# Patient Record
Sex: Female | Born: 1990 | Race: Asian | Hispanic: Yes | Marital: Single | Smoking: Never smoker
Health system: Southern US, Community
[De-identification: ages and names within clinical notes are randomized; demographics above are authoritative.]

## PROBLEM LIST (undated history)

## (undated) DIAGNOSIS — M329 Systemic lupus erythematosus, unspecified: Secondary | ICD-10-CM

## (undated) DIAGNOSIS — G43909 Migraine, unspecified, not intractable, without status migrainosus: Secondary | ICD-10-CM

## (undated) DIAGNOSIS — IMO0002 Reserved for concepts with insufficient information to code with codable children: Secondary | ICD-10-CM

## (undated) DIAGNOSIS — E282 Polycystic ovarian syndrome: Secondary | ICD-10-CM

---

## 2016-05-04 DIAGNOSIS — J4520 Mild intermittent asthma, uncomplicated: Secondary | ICD-10-CM | POA: Diagnosis not present

## 2016-05-04 DIAGNOSIS — L930 Discoid lupus erythematosus: Secondary | ICD-10-CM | POA: Diagnosis not present

## 2016-06-28 DIAGNOSIS — L930 Discoid lupus erythematosus: Secondary | ICD-10-CM | POA: Diagnosis not present

## 2016-06-28 DIAGNOSIS — M2550 Pain in unspecified joint: Secondary | ICD-10-CM | POA: Diagnosis not present

## 2016-06-28 DIAGNOSIS — R5383 Other fatigue: Secondary | ICD-10-CM | POA: Diagnosis not present

## 2016-09-04 DIAGNOSIS — L930 Discoid lupus erythematosus: Secondary | ICD-10-CM | POA: Diagnosis not present

## 2016-09-04 DIAGNOSIS — M2550 Pain in unspecified joint: Secondary | ICD-10-CM | POA: Diagnosis not present

## 2016-09-04 DIAGNOSIS — G43809 Other migraine, not intractable, without status migrainosus: Secondary | ICD-10-CM | POA: Diagnosis not present

## 2016-11-08 DIAGNOSIS — K219 Gastro-esophageal reflux disease without esophagitis: Secondary | ICD-10-CM | POA: Diagnosis not present

## 2016-12-27 DIAGNOSIS — Z23 Encounter for immunization: Secondary | ICD-10-CM | POA: Diagnosis not present

## 2016-12-27 DIAGNOSIS — G43809 Other migraine, not intractable, without status migrainosus: Secondary | ICD-10-CM | POA: Diagnosis not present

## 2016-12-27 DIAGNOSIS — L930 Discoid lupus erythematosus: Secondary | ICD-10-CM | POA: Diagnosis not present

## 2016-12-27 DIAGNOSIS — M2550 Pain in unspecified joint: Secondary | ICD-10-CM | POA: Diagnosis not present

## 2017-03-18 DIAGNOSIS — L930 Discoid lupus erythematosus: Secondary | ICD-10-CM | POA: Diagnosis not present

## 2017-04-18 DIAGNOSIS — L930 Discoid lupus erythematosus: Secondary | ICD-10-CM | POA: Diagnosis not present

## 2017-04-18 DIAGNOSIS — B078 Other viral warts: Secondary | ICD-10-CM | POA: Diagnosis not present

## 2017-06-03 ENCOUNTER — Other Ambulatory Visit: Payer: Self-pay | Admitting: Family Medicine

## 2017-06-03 ENCOUNTER — Other Ambulatory Visit (HOSPITAL_COMMUNITY)
Admission: RE | Admit: 2017-06-03 | Discharge: 2017-06-03 | Disposition: A | Discharge status: Home / Self Care | Payer: 59 | Source: Ambulatory Visit | Attending: Family Medicine | Admitting: Family Medicine

## 2017-06-03 DIAGNOSIS — Z131 Encounter for screening for diabetes mellitus: Secondary | ICD-10-CM | POA: Diagnosis not present

## 2017-06-03 DIAGNOSIS — G43909 Migraine, unspecified, not intractable, without status migrainosus: Secondary | ICD-10-CM | POA: Diagnosis not present

## 2017-06-03 DIAGNOSIS — L930 Discoid lupus erythematosus: Secondary | ICD-10-CM | POA: Diagnosis not present

## 2017-06-03 DIAGNOSIS — Z124 Encounter for screening for malignant neoplasm of cervix: Secondary | ICD-10-CM | POA: Insufficient documentation

## 2017-06-03 DIAGNOSIS — Z13220 Encounter for screening for lipoid disorders: Secondary | ICD-10-CM | POA: Diagnosis not present

## 2017-06-03 DIAGNOSIS — Z01419 Encounter for gynecological examination (general) (routine) without abnormal findings: Secondary | ICD-10-CM | POA: Diagnosis not present

## 2017-06-03 DIAGNOSIS — Z0001 Encounter for general adult medical examination with abnormal findings: Secondary | ICD-10-CM | POA: Diagnosis not present

## 2017-06-05 LAB — CYTOLOGY - PAP
CHLAMYDIA, DNA PROBE: NEGATIVE
HPV: NOT DETECTED
Neisseria Gonorrhea: NEGATIVE

## 2017-06-24 DIAGNOSIS — L930 Discoid lupus erythematosus: Secondary | ICD-10-CM | POA: Diagnosis not present

## 2017-06-26 DIAGNOSIS — L568 Other specified acute skin changes due to ultraviolet radiation: Secondary | ICD-10-CM | POA: Diagnosis not present

## 2017-06-26 DIAGNOSIS — M2550 Pain in unspecified joint: Secondary | ICD-10-CM | POA: Diagnosis not present

## 2017-06-26 DIAGNOSIS — L930 Discoid lupus erythematosus: Secondary | ICD-10-CM | POA: Diagnosis not present

## 2017-07-29 DIAGNOSIS — R87612 Low grade squamous intraepithelial lesion on cytologic smear of cervix (LGSIL): Secondary | ICD-10-CM | POA: Diagnosis not present

## 2017-07-29 DIAGNOSIS — R102 Pelvic and perineal pain: Secondary | ICD-10-CM | POA: Diagnosis not present

## 2017-09-24 DIAGNOSIS — L930 Discoid lupus erythematosus: Secondary | ICD-10-CM | POA: Diagnosis not present

## 2017-10-28 DIAGNOSIS — M2550 Pain in unspecified joint: Secondary | ICD-10-CM | POA: Diagnosis not present

## 2017-10-28 DIAGNOSIS — E559 Vitamin D deficiency, unspecified: Secondary | ICD-10-CM | POA: Diagnosis not present

## 2017-10-28 DIAGNOSIS — L930 Discoid lupus erythematosus: Secondary | ICD-10-CM | POA: Diagnosis not present

## 2017-12-24 DIAGNOSIS — Z23 Encounter for immunization: Secondary | ICD-10-CM | POA: Diagnosis not present

## 2018-01-27 DIAGNOSIS — E559 Vitamin D deficiency, unspecified: Secondary | ICD-10-CM | POA: Diagnosis not present

## 2018-01-27 DIAGNOSIS — L930 Discoid lupus erythematosus: Secondary | ICD-10-CM | POA: Diagnosis not present

## 2018-01-27 DIAGNOSIS — M2550 Pain in unspecified joint: Secondary | ICD-10-CM | POA: Diagnosis not present

## 2018-05-23 ENCOUNTER — Emergency Department (HOSPITAL_BASED_OUTPATIENT_CLINIC_OR_DEPARTMENT_OTHER)
Admission: EM | Admit: 2018-05-23 | Discharge: 2018-05-23 | Disposition: A | Discharge status: Home / Self Care | Payer: 59 | Attending: Emergency Medicine | Admitting: Emergency Medicine

## 2018-05-23 ENCOUNTER — Emergency Department (HOSPITAL_BASED_OUTPATIENT_CLINIC_OR_DEPARTMENT_OTHER): Payer: 59

## 2018-05-23 ENCOUNTER — Encounter (HOSPITAL_BASED_OUTPATIENT_CLINIC_OR_DEPARTMENT_OTHER): Payer: Self-pay | Admitting: *Deleted

## 2018-05-23 ENCOUNTER — Other Ambulatory Visit: Payer: Self-pay

## 2018-05-23 DIAGNOSIS — S61211A Laceration without foreign body of left index finger without damage to nail, initial encounter: Secondary | ICD-10-CM | POA: Diagnosis present

## 2018-05-23 DIAGNOSIS — Y9389 Activity, other specified: Secondary | ICD-10-CM | POA: Insufficient documentation

## 2018-05-23 DIAGNOSIS — Y999 Unspecified external cause status: Secondary | ICD-10-CM | POA: Insufficient documentation

## 2018-05-23 DIAGNOSIS — W260XXA Contact with knife, initial encounter: Secondary | ICD-10-CM | POA: Insufficient documentation

## 2018-05-23 DIAGNOSIS — Y929 Unspecified place or not applicable: Secondary | ICD-10-CM | POA: Insufficient documentation

## 2018-05-23 DIAGNOSIS — Z23 Encounter for immunization: Secondary | ICD-10-CM | POA: Diagnosis not present

## 2018-05-23 HISTORY — DX: Reserved for concepts with insufficient information to code with codable children: IMO0002

## 2018-05-23 HISTORY — DX: Migraine, unspecified, not intractable, without status migrainosus: G43.909

## 2018-05-23 HISTORY — DX: Systemic lupus erythematosus, unspecified: M32.9

## 2018-05-23 IMAGING — CR LEFT INDEX FINGER 2+V
3 series · 3 of 3 positions shown · non-contrast
Comparison: None.

CLINICAL DATA: Patient cut finger with knife while cutting food.

EXAM:
LEFT INDEX FINGER 2+V

[x finger pa left]
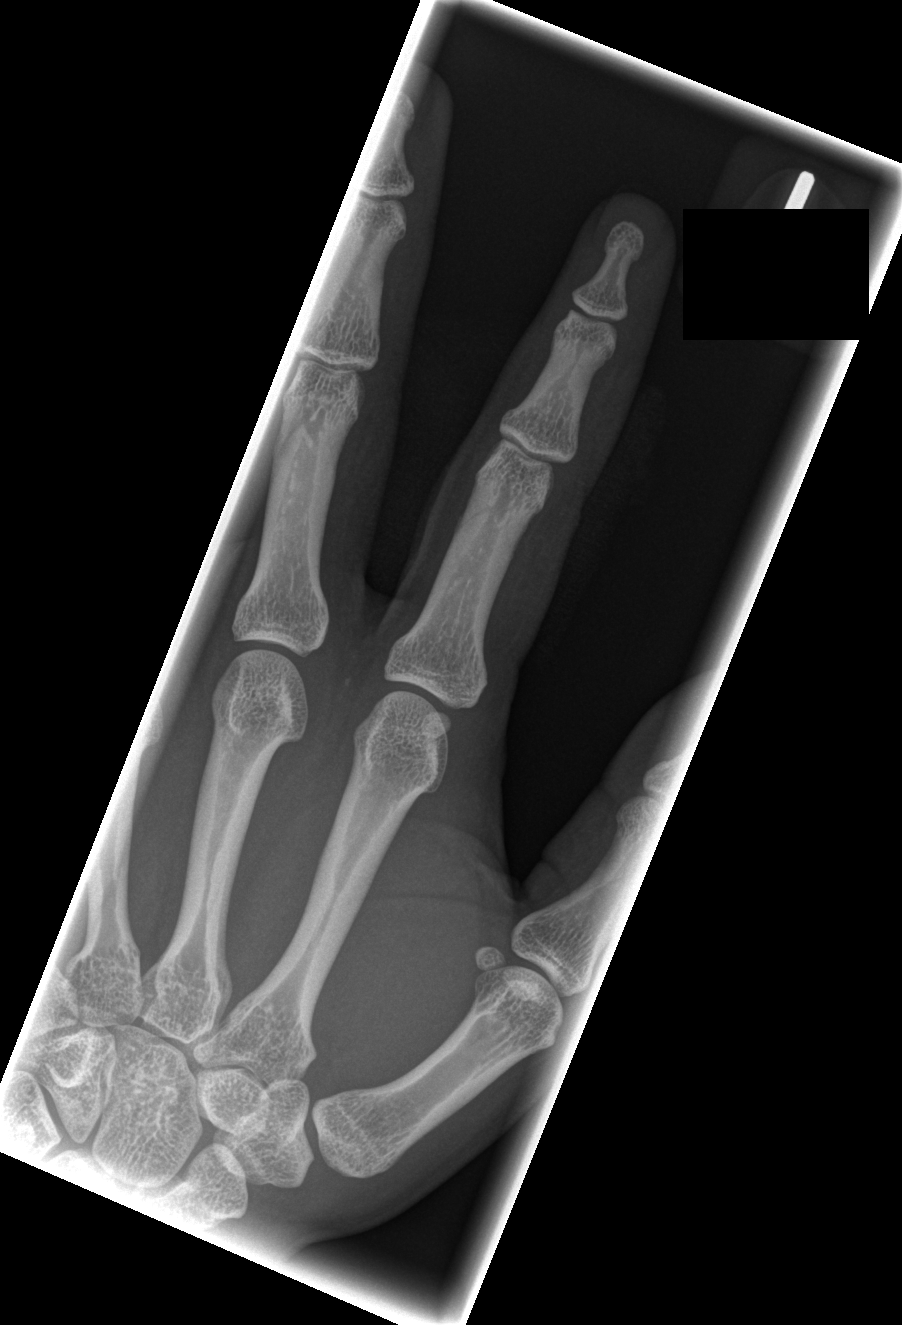

[x finger obl. left]
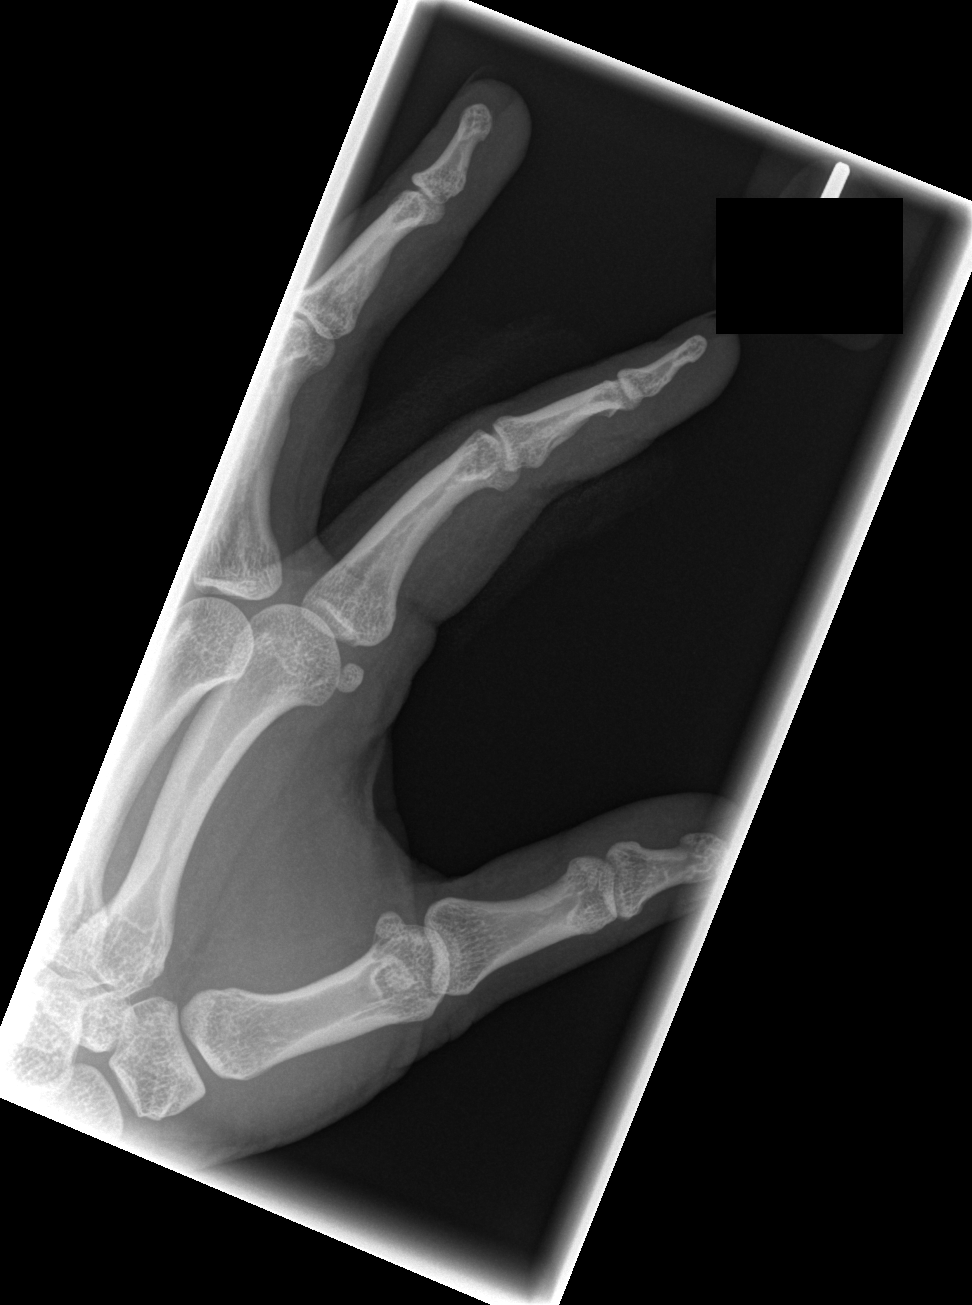

[x finger lateral left]
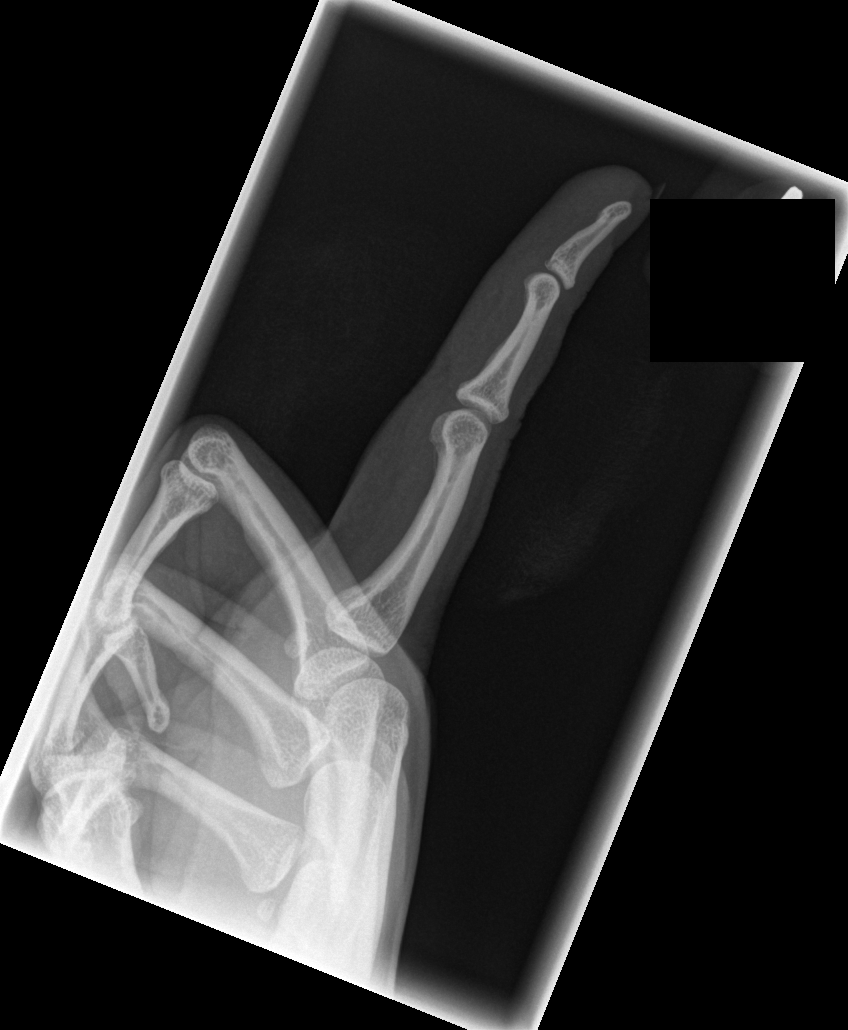

[3 of 3 positions shown; findings below may reference images not displayed]

FINDINGS: No fracture or foreign body
IMPRESSION: Negative.

## 2018-05-23 MED ORDER — BACITRACIN ZINC 500 UNIT/GM EX OINT
TOPICAL_OINTMENT | Freq: Once | CUTANEOUS | Status: AC
  Administered 2018-05-23: 1 via TOPICAL

## 2018-05-23 MED ORDER — TETANUS-DIPHTH-ACELL PERTUSSIS 5-2.5-18.5 LF-MCG/0.5 IM SUSP
0.5000 mL | Freq: Once | INTRAMUSCULAR | Status: AC
  Administered 2018-05-23: 0.5 mL via INTRAMUSCULAR
  Filled 2018-05-23: qty 0.5

## 2018-05-23 MED ORDER — LIDOCAINE HCL (PF) 1 % IJ SOLN
10.0000 mL | Freq: Once | INTRAMUSCULAR | Status: AC
  Administered 2018-05-23: 10 mL via INTRADERMAL
  Filled 2018-05-23: qty 10

## 2018-05-23 NOTE — ED Triage Notes (Signed)
Laceration to her left index finger on a pocket knife tonight while cutting cork.

## 2018-05-23 NOTE — ED Notes (Signed)
ED Provider at bedside to provide sutures.

## 2018-05-23 NOTE — ED Provider Notes (Signed)
[facility] EMERGENCY DEPARTMENT Provider Note   CSN: [id number] Arrival date & time: 05/23/18  2008    History   Chief Complaint Chief Complaint  Patient presents with  . Laceration    HPI Melanie Humphrey is a 28 y.o. female who presents for evaluation of laceration left index finger that occurred just prior to ED arrival.  Patient reports that she was using a pocket knife to cut a cork and states that it slipped and caused a laceration to the volar aspect of her left index finger.  She reports her last tetanus was in 2012.  She is not currently on any blood thinners.  Patient denies any numbness/weakness.     The history is provided by the patient.    Past Medical History:  Diagnosis Date  . Lupus (HCC)   . Migraine     There are no active problems to display for this patient.   History reviewed. No pertinent surgical history.   OB History   No obstetric history on file.      Home Medications    Prior to Admission medications   Not on File    Family History No family history on file.  Social History Social History   Tobacco Use  . Smoking status: Never Smoker  . Smokeless tobacco: Never Used  Substance Use Topics  . Alcohol use: Not Currently  . Drug use: Never     Allergies   Ciprofloxacin and Clindamycin/lincomycin   Review of Systems Review of Systems  Skin: Positive for wound.  Neurological: Negative for weakness and numbness.  All other systems reviewed and are negative.    Physical Exam Updated Vital Signs BP 110/76 (BP Location: Right Arm)   Pulse 71   Temp 97.6 F (36.4 C) (Oral)   Resp 16   Ht 4\' 11"  (1.499 m)   Wt 63.5 kg   SpO2 100%   BMI 28.28 kg/m   Physical Exam Vitals signs and nursing note reviewed.  Constitutional:      Appearance: She is well-developed.  HENT:     Head: Normocephalic and atraumatic.  Eyes:     General: No scleral icterus.       Right eye: No discharge.        Left eye: No  discharge.     Conjunctiva/sclera: Conjunctivae normal.  Cardiovascular:     Pulses:          Radial pulses are 2+ on the right side and 2+ on the left side.  Pulmonary:     Effort: Pulmonary effort is normal.  Musculoskeletal:     Comments: Full range of motion of all 5 digits of left hand intact without any difficulty.  Patient can fully flex and extend both the DIP and PIP joint of the left index finger when held in isolation.  Skin:    General: Skin is warm and dry.     Capillary Refill: Capillary refill takes less than 2 seconds.     Comments: 2 cm jagged laceration noted to the volar asp of the left index finger between the DIP and PIP joint.  The axial end of the laceration crosses over the lateral aspect of the PIP joint. Good distal cap refill.  LUE is not dusky in appearance or cool to touch.  Neurological:     Mental Status: She is alert.  Psychiatric:        Speech: Speech normal.        Behavior: Behavior normal.  ED Treatments / Results  Labs (all labs ordered are listed, but only abnormal results are displayed) Labs Reviewed - No data to display  EKG None  Radiology Dg Finger Index Left  Result Date: 05/23/2018 CLINICAL DATA:  Patient cut finger with knife while cutting food. EXAM: LEFT INDEX FINGER 2+V COMPARISON:  None. FINDINGS: No fracture or foreign body IMPRESSION: Negative. Electronically Signed   By: Gerome Sam III M.D   On: 05/23/2018 20:53    Procedures .Marland KitchenLaceration Repair Date/Time: 05/23/2018 9:31 PM Performed by: Maxwell Caul, PA-C Authorized by: Maxwell Caul, PA-C   Consent:    Consent obtained:  Verbal   Consent given by:  Patient   Risks discussed:  Infection, need for additional repair, pain, poor cosmetic result and poor wound healing   Alternatives discussed:  No treatment and delayed treatment Universal protocol:    Procedure explained and questions answered to patient or proxy's satisfaction: yes     Relevant  documents present and verified: yes     Test results available and properly labeled: yes     Imaging studies available: yes     Required blood products, implants, devices, and special equipment available: yes     Site/side marked: yes     Immediately prior to procedure, a time out was called: yes     Patient identity confirmed:  Verbally with patient Anesthesia (see MAR for exact dosages):    Anesthesia method:  Nerve block   Block needle gauge:  25 G   Block anesthetic:  Lidocaine 1% w/o epi   Block injection procedure:  Anatomic landmarks identified, introduced needle, incremental injection and negative aspiration for blood Laceration details:    Location:  Finger   Finger location:  L index finger   Length (cm):  2 Repair type:    Repair type:  Simple Pre-procedure details:    Preparation:  Patient was prepped and draped in usual sterile fashion Exploration:    Hemostasis achieved with:  Tourniquet   Wound exploration: wound explored through full range of motion     Wound extent: no foreign bodies/material noted and no tendon damage noted   Treatment:    Area cleansed with:  Betadine   Amount of cleaning:  Extensive   Irrigation solution:  Sterile water   Irrigation method:  Syringe   Visualized foreign bodies/material removed: no   Skin repair:    Repair method:  Sutures   Suture size:  5-0   Wound skin closure material used: Vicryl Rapide.   Suture technique:  Simple interrupted   Number of sutures:  5 Approximation:    Approximation:  Close Post-procedure details:    Dressing:  Antibiotic ointment, splint for protection and non-adherent dressing   Patient tolerance of procedure:  Tolerated well, no immediate complications Comments:     Once the wound was anesthetized, was thoroughly extensively irrigated with sterile water.  Full evaluation of the wound showed no evidence of foreign body.  Wound explored through full range of motion and showed no evidence of tendon  abnormality.  No visualization of any tendon injury.    (including critical care time)  Medications Ordered in ED Medications  Tdap (BOOSTRIX) injection 0.5 mL (0.5 mLs Intramuscular Given 05/23/18 2035)  lidocaine (PF) (XYLOCAINE) 1 % injection 10 mL (10 mLs Intradermal Given 05/23/18 2036)  bacitracin ointment (1 application Topical Given 05/23/18 2138)     Initial Impression / Assessment and Plan / ED Course  I have reviewed the  triage vital signs and the nursing notes.  Pertinent labs & imaging results that were available during my care of the patient were reviewed by me and considered in my medical decision making (see chart for details).        28 year old female who presents for evaluation of laceration noted to the volar aspect of her left index finger.  Reports she was cutting a cork with a knife and the knife slipped and cut her finger.  Tetanus is not up-to-date. Patient is afebrile, non-toxic appearing, sitting comfortably on examination table. Vital signs reviewed and stable.  Patient is neurovascularly intact.  Plan for wound care, update tetanus.  X-ray reviewed.  Negative for any acute bony abnormality.  Laceration repaired as documented above.  Patient tolerated procedure well. At this time, patient exhibits no emergent life-threatening condition that require further evaluation in ED or admission. Patient had ample opportunity for questions and discussion. All patient's questions were answered with full understanding. Strict return precautions discussed. Patient expresses understanding and agreement to plan.   Portions of this note were generated with Scientist, clinical (histocompatibility and immunogenetics). Dictation errors may occur despite best attempts at proofreading.    Final Clinical Impressions(s) / ED Diagnoses   Final diagnoses:  Laceration of left index finger without foreign body without damage to nail, initial encounter    ED Discharge Orders    None       Rosana Hoes  05/23/18 2150    Rolan Bucco, MD 05/23/18 2210

## 2018-05-23 NOTE — ED Notes (Signed)
ED Provider at bedside. 

## 2018-05-23 NOTE — ED Notes (Signed)
Patient transported to X-ray 

## 2018-05-23 NOTE — Discharge Instructions (Signed)
Keep the wound clean and dry for the first 24 hours. After that you may gently clean the wound with soap and water. Make sure to pat dry the wound before covering it with any dressing. You can use topical antibiotic ointment and bandage. Ice and elevate for pain relief.   You can take Tylenol or Ibuprofen as directed for pain. You can alternate Tylenol and Ibuprofen every 4 hours for additional pain relief.   In about 5 to 7 days, you will gently tugged at the tail ends of the sutures.  If you meet resistance, wait another day and try again.  If not, we should easily removed.  The suture should be removed by day 8 or 9.  Monitor closely for any signs of infection. Return to the Emergency Department for any worsening redness/swelling of the area that begins to spread, drainage from the site, worsening pain, fever or any other worsening or concerning symptoms.

## 2018-09-24 ENCOUNTER — Other Ambulatory Visit: Payer: Self-pay | Admitting: Obstetrics and Gynecology

## 2018-09-24 ENCOUNTER — Other Ambulatory Visit (HOSPITAL_COMMUNITY)
Admission: RE | Admit: 2018-09-24 | Discharge: 2018-09-24 | Disposition: A | Discharge status: Home / Self Care | Payer: 59 | Source: Ambulatory Visit | Attending: Obstetrics and Gynecology | Admitting: Obstetrics and Gynecology

## 2018-09-24 DIAGNOSIS — Z124 Encounter for screening for malignant neoplasm of cervix: Secondary | ICD-10-CM | POA: Diagnosis present

## 2018-09-26 ENCOUNTER — Other Ambulatory Visit: Payer: Self-pay | Admitting: Obstetrics and Gynecology

## 2018-09-26 DIAGNOSIS — N9489 Other specified conditions associated with female genital organs and menstrual cycle: Secondary | ICD-10-CM

## 2018-09-29 ENCOUNTER — Other Ambulatory Visit: Payer: 59

## 2018-09-29 LAB — CYTOLOGY - PAP
Diagnosis: UNDETERMINED — AB
HPV: DETECTED — AB

## 2018-10-07 ENCOUNTER — Ambulatory Visit
Admission: RE | Admit: 2018-10-07 | Discharge: 2018-10-07 | Disposition: A | Discharge status: Home / Self Care | Payer: 59 | Source: Ambulatory Visit | Attending: Obstetrics and Gynecology | Admitting: Obstetrics and Gynecology

## 2018-10-07 DIAGNOSIS — N9489 Other specified conditions associated with female genital organs and menstrual cycle: Secondary | ICD-10-CM

## 2018-10-07 MED ORDER — IOPAMIDOL (ISOVUE-300) INJECTION 61%
100.0000 mL | Freq: Once | INTRAVENOUS | Status: AC | PRN
  Administered 2018-10-07: 100 mL via INTRAVENOUS

## 2018-10-15 ENCOUNTER — Other Ambulatory Visit: Payer: Self-pay | Admitting: Obstetrics and Gynecology

## 2018-12-12 ENCOUNTER — Other Ambulatory Visit: Payer: Self-pay

## 2018-12-12 ENCOUNTER — Encounter (HOSPITAL_BASED_OUTPATIENT_CLINIC_OR_DEPARTMENT_OTHER): Payer: Self-pay

## 2018-12-12 ENCOUNTER — Emergency Department (HOSPITAL_BASED_OUTPATIENT_CLINIC_OR_DEPARTMENT_OTHER)
Admission: EM | Admit: 2018-12-12 | Discharge: 2018-12-12 | Disposition: A | Discharge status: Home / Self Care | Payer: 59 | Attending: Emergency Medicine | Admitting: Emergency Medicine

## 2018-12-12 DIAGNOSIS — R000 Tachycardia, unspecified: Secondary | ICD-10-CM

## 2018-12-12 LAB — URINALYSIS, MICROSCOPIC (REFLEX): WBC, UA: NONE SEEN WBC/hpf (ref 0–5)

## 2018-12-12 LAB — BASIC METABOLIC PANEL
Anion gap: 8 (ref 5–15)
BUN: 14 mg/dL (ref 6–20)
CO2: 25 mmol/L (ref 22–32)
Calcium: 9.3 mg/dL (ref 8.9–10.3)
Chloride: 105 mmol/L (ref 98–111)
Creatinine, Ser: 0.64 mg/dL (ref 0.44–1.00)
GFR calc Af Amer: >60 mL/min (ref >60)
GFR calc non Af Amer: >60 mL/min (ref >60)
Glucose, Bld: 91 mg/dL (ref 70–99)
Potassium: 3.7 mmol/L (ref 3.5–5.1)
Sodium: 138 mmol/L (ref 135–145)

## 2018-12-12 LAB — CBC
HCT: 42.7 % (ref 36.0–46.0)
Hemoglobin: 13.9 g/dL (ref 12.0–15.0)
MCH: 29.8 pg (ref 26.0–34.0)
MCHC: 32.6 g/dL (ref 30.0–36.0)
MCV: 91.4 fL (ref 80.0–100.0)
Platelets: 348 10*3/uL (ref 150–400)
RBC: 4.67 MIL/uL (ref 3.87–5.11)
RDW: 12.1 % (ref 11.5–15.5)
WBC: 8.2 10*3/uL (ref 4.0–10.5)
nRBC: 0 % (ref 0.0–0.2)

## 2018-12-12 LAB — D-DIMER, QUANTITATIVE: D-Dimer, Quant: <0.27 ug/mL-FEU (ref 0.00–0.50)

## 2018-12-12 LAB — URINALYSIS, ROUTINE W REFLEX MICROSCOPIC
Bilirubin Urine: NEGATIVE
Glucose, UA: NEGATIVE mg/dL
Ketones, ur: NEGATIVE mg/dL
Leukocytes,Ua: NEGATIVE
Nitrite: NEGATIVE
Protein, ur: NEGATIVE mg/dL
Specific Gravity, Urine: <1.005 — ABNORMAL LOW (ref 1.005–1.030)
pH: 6.5 (ref 5.0–8.0)

## 2018-12-12 LAB — TSH: TSH: 1.281 u[IU]/mL (ref 0.350–4.500)

## 2018-12-12 LAB — PREGNANCY, URINE: Preg Test, Ur: NEGATIVE

## 2018-12-12 NOTE — ED Provider Notes (Signed)
[facility] EMERGENCY DEPARTMENT Provider Note   CSN: [id number] Arrival date & time: 12/12/18  1640     History   Chief Complaint Chief Complaint  Patient presents with  . Tachycardia    HPI Melanie Humphrey is a 28 y.o. female.     Patient with history of lupus, on chronic aspirin presents to the emergency department with complaint of palpitations and rapid heartbeat with a sensation of her heart hitting the inside of her chest.  This has been intermittent, improved when the patient is distracted, but ongoing over the past 2 weeks.  She states that she has shortness of breath with the palpitations.  She also has some mild shortness of breath with exertion.  No chest tightness or chest pain.  Patient has had some cramping in her left calf.  No history of blood clots but does take aspirin because she was told her blood is thick due to lupus.  No lightheadedness or syncope.  No recent surgeries or immobilizations.  No history of thyroid problems.  No recent weight loss or weight gain.  No abdominal pain, vomiting, or diarrhea.       Past Medical History:  Diagnosis Date  . Lupus (HCC)   . Migraine     There are no active problems to display for this patient.   History reviewed. No pertinent surgical history.   OB History   No obstetric history on file.      Home Medications    Prior to Admission medications   Not on File    Family History No family history on file.  Social History Social History   Tobacco Use  . Smoking status: Never Smoker  . Smokeless tobacco: Never Used  Substance Use Topics  . Alcohol use: Not Currently  . Drug use: Never     Allergies   Ciprofloxacin and Clindamycin/lincomycin   Review of Systems Review of Systems  Constitutional: Negative for fever and unexpected weight change.  HENT: Negative for rhinorrhea and sore throat.   Eyes: Negative for redness.  Respiratory: Positive for shortness of breath. Negative for  cough.   Cardiovascular: Positive for leg swelling. Negative for chest pain.  Gastrointestinal: Negative for abdominal pain, diarrhea, nausea and vomiting.  Genitourinary: Negative for dysuria.  Musculoskeletal: Positive for myalgias.  Skin: Negative for rash.  Neurological: Negative for headaches.     Physical Exam Updated Vital Signs BP 124/81 (BP Location: Left Arm)   Pulse (!) 116   Temp 98.7 F (37.1 C) (Oral)   Resp 20   Ht 4\' 11"  (1.499 m)   Wt 59 kg   LMP 11/26/2018   SpO2 100%   BMI 26.26 kg/m   Physical Exam Vitals signs and nursing note reviewed.  Constitutional:      Appearance: She is well-developed.  HENT:     Head: Normocephalic and atraumatic.  Eyes:     General:        Right eye: No discharge.        Left eye: No discharge.     Conjunctiva/sclera: Conjunctivae normal.  Neck:     Musculoskeletal: Normal range of motion and neck supple.  Cardiovascular:     Rate and Rhythm: Regular rhythm. Tachycardia present.     Heart sounds: Normal heart sounds.  Pulmonary:     Effort: Pulmonary effort is normal.     Breath sounds: Normal breath sounds.  Abdominal:     Palpations: Abdomen is soft.     Tenderness:  There is no abdominal tenderness. There is no guarding or rebound.  Musculoskeletal:     Right lower leg: No edema.     Left lower leg: No edema.     Comments: Reports some tenderness in the left calf but there is no associated swelling, edema, erythema.  Skin:    General: Skin is warm and dry.  Neurological:     Mental Status: She is alert.      ED Treatments / Results  Labs (all labs ordered are listed, but only abnormal results are displayed) Labs Reviewed  URINALYSIS, ROUTINE W REFLEX MICROSCOPIC - Abnormal; Notable for the following components:      Result Value   Specific Gravity, Urine <1.005 (*)    Hgb urine dipstick TRACE (*)    All other components within normal limits  URINALYSIS, MICROSCOPIC (REFLEX) - Abnormal; Notable for the  following components:   Bacteria, UA RARE (*)    All other components within normal limits  PREGNANCY, URINE  CBC  BASIC METABOLIC PANEL  D-DIMER, QUANTITATIVE (NOT AT [facility])  TSH   ED ECG REPORT   Date: 12/12/2018  Rate: 115  Rhythm: sinus tachycardia  QRS Axis: right  Intervals: normal  ST/T Wave abnormalities: normal  Conduction Disutrbances:none  Narrative Interpretation:   Old EKG Reviewed: none available  I have personally reviewed the EKG tracing and agree with the computerized printout as noted.   Radiology No results found.  Procedures Procedures (including critical care time)  Medications Ordered in ED Medications - No data to display   Initial Impression / Assessment and Plan / ED Course  I have reviewed the triage vital signs and the nursing notes.  Pertinent labs & imaging results that were available during my care of the patient were reviewed by me and considered in my medical decision making (see chart for details).        Patient seen and examined. Work-up initiated.  Given patient's symptoms, will evaluate for PE with D-dimer.  Patient with cramping in her calf, palpitations and tachycardia, in setting of lupus.  Vital signs reviewed and are as follows: BP 124/81 (BP Location: Left Arm)   Pulse (!) 116   Temp 98.7 F (37.1 C) (Oral)   Resp 20   Ht 4\' 11"  (1.499 m)   Wt 59 kg   LMP 11/26/2018   SpO2 100%   BMI 26.26 kg/m   6:59 PM labs with normal D-dimer, normal hemoglobin.  Patient rechecked.  Heart rate in the 80s at rest.  Thyroid studies pending.  Patient updated on results.  Will discharge.  She will follow up with her primary care doctor.  Encouraged to avoid caffeine and cold medications, get plenty of rest, hydrate well.  Encouraged return with chest pain, shortness of breath, new symptoms or other concerns.  Encourage PCP follow-up for continued work-up.  Final Clinical Impressions(s) / ED Diagnoses   Final diagnoses:  Sinus  tachycardia by electrocardiogram   Patient presents with episodes of rapid heartbeat and palpitations.  Patient found to be in sinus tachycardia upon arrival without any other abnormal EKG findings.  Patient is not anemic.  She is not pregnant.  D-dimer ordered given history of lupus and this was negative.  Doubt DVT/PE.  No concerns for ACS, pericarditis, myocarditis.  Thyroid testing pending.  Doubt thyrotoxicosis.  Patient will follow up with her doctor, return with worsening.  ED Discharge Orders    None       Carlisle Cater, [place]  12/12/18 1903    Vanetta MuldersZackowski, [place], MD 12/22/18 [phone]

## 2018-12-12 NOTE — ED Notes (Signed)
Pt reports recent IUD removal and palpitations followed shortly after.

## 2018-12-12 NOTE — Discharge Instructions (Signed)
Please read and follow all provided instructions.  Your diagnoses today include:  1. Sinus tachycardia by electrocardiogram     Tests performed today include:  Blood counts electrolytes -normal  D-dimer, screening test for blood -was normal  Urine testing -normal  EKG shows fast heart rate but regular rhythm  Thyroid test -will return tomorrow  Vital signs. See below for your results today.   Medications prescribed:   None  Take any prescribed medications only as directed.  Home care instructions:  Follow any educational materials contained in this packet.  BE VERY CAREFUL not to take multiple medicines containing Tylenol (also called acetaminophen). Doing so can lead to an overdose which can damage your liver and cause liver failure and possibly death.   Follow-up instructions: Please follow-up with your primary care provider in the next 3 days for further evaluation of your symptoms.   Return instructions:   Please return to the Emergency Department if you experience worsening symptoms.   Please return if you have any other emergent concerns.  Additional Information:  Your vital signs today were: BP 111/84    Pulse 99    Temp 98.7 F (37.1 C) (Oral)    Resp 15    Ht 4\' 11"  (1.499 m)    Wt 59 kg    LMP 11/26/2018    SpO2 100%    BMI 26.26 kg/m  If your blood pressure (BP) was elevated above 135/85 this visit, please have this repeated by your doctor within one month. --------------

## 2018-12-12 NOTE — ED Triage Notes (Signed)
Pt c/o heart racing and pounding x 2 weeks-NAD-steady gait

## 2019-10-13 ENCOUNTER — Other Ambulatory Visit: Payer: Self-pay | Admitting: Obstetrics & Gynecology

## 2019-10-13 DIAGNOSIS — N6310 Unspecified lump in the right breast, unspecified quadrant: Secondary | ICD-10-CM

## 2019-11-02 ENCOUNTER — Other Ambulatory Visit: Payer: 59

## 2019-11-20 ENCOUNTER — Other Ambulatory Visit: Payer: Self-pay

## 2019-11-20 ENCOUNTER — Ambulatory Visit
Admission: RE | Admit: 2019-11-20 | Discharge: 2019-11-20 | Disposition: A | Discharge status: Home / Self Care | Payer: 59 | Source: Ambulatory Visit | Attending: Obstetrics & Gynecology | Admitting: Obstetrics & Gynecology

## 2019-11-20 DIAGNOSIS — N6310 Unspecified lump in the right breast, unspecified quadrant: Secondary | ICD-10-CM

## 2022-06-27 ENCOUNTER — Other Ambulatory Visit: Payer: Self-pay

## 2022-06-27 ENCOUNTER — Encounter (HOSPITAL_BASED_OUTPATIENT_CLINIC_OR_DEPARTMENT_OTHER): Payer: Self-pay | Admitting: Emergency Medicine

## 2022-06-27 ENCOUNTER — Emergency Department (HOSPITAL_BASED_OUTPATIENT_CLINIC_OR_DEPARTMENT_OTHER)
Admission: EM | Admit: 2022-06-27 | Discharge: 2022-06-27 | Disposition: A | Discharge status: Home / Self Care | Payer: BC Managed Care – PPO | Attending: Emergency Medicine | Admitting: Emergency Medicine

## 2022-06-27 DIAGNOSIS — Z7982 Long term (current) use of aspirin: Secondary | ICD-10-CM | POA: Insufficient documentation

## 2022-06-27 DIAGNOSIS — H81391 Other peripheral vertigo, right ear: Secondary | ICD-10-CM | POA: Diagnosis not present

## 2022-06-27 DIAGNOSIS — R42 Dizziness and giddiness: Secondary | ICD-10-CM | POA: Diagnosis present

## 2022-06-27 HISTORY — DX: Polycystic ovarian syndrome: E28.2

## 2022-06-27 LAB — CBC WITH DIFFERENTIAL/PLATELET
Abs Immature Granulocytes: 0.02 10*3/uL (ref 0.00–0.07)
Basophils Absolute: 0.1 10*3/uL (ref 0.0–0.1)
Basophils Relative: 1 %
Eosinophils Absolute: 0 10*3/uL (ref 0.0–0.5)
Eosinophils Relative: 1 %
HCT: 40.2 % (ref 36.0–46.0)
Hemoglobin: 13.6 g/dL (ref 12.0–15.0)
Immature Granulocytes: 0 %
Lymphocytes Relative: 25 %
Lymphs Abs: 1.8 10*3/uL (ref 0.7–4.0)
MCH: 29.6 pg (ref 26.0–34.0)
MCHC: 33.8 g/dL (ref 30.0–36.0)
MCV: 87.6 fL (ref 80.0–100.0)
Monocytes Absolute: 0.7 10*3/uL (ref 0.1–1.0)
Monocytes Relative: 9 %
Neutro Abs: 4.7 10*3/uL (ref 1.7–7.7)
Neutrophils Relative %: 64 %
Platelets: 315 10*3/uL (ref 150–400)
RBC: 4.59 MIL/uL (ref 3.87–5.11)
RDW: 12.4 % (ref 11.5–15.5)
WBC: 7.3 10*3/uL (ref 4.0–10.5)
nRBC: 0 % (ref 0.0–0.2)

## 2022-06-27 LAB — COMPREHENSIVE METABOLIC PANEL
ALT: 22 U/L (ref 0–44)
AST: 21 U/L (ref 15–41)
Albumin: 3.8 g/dL (ref 3.5–5.0)
Alkaline Phosphatase: 53 U/L (ref 38–126)
Anion gap: 7 (ref 5–15)
BUN: 13 mg/dL (ref 6–20)
CO2: 26 mmol/L (ref 22–32)
Calcium: 8.9 mg/dL (ref 8.9–10.3)
Chloride: 105 mmol/L (ref 98–111)
Creatinine, Ser: 0.69 mg/dL (ref 0.44–1.00)
GFR, Estimated: >60 mL/min (ref >60)
Glucose, Bld: 100 mg/dL — ABNORMAL HIGH (ref 70–99)
Potassium: 3.8 mmol/L (ref 3.5–5.1)
Sodium: 138 mmol/L (ref 135–145)
Total Bilirubin: 0.5 mg/dL (ref 0.3–1.2)
Total Protein: 7.1 g/dL (ref 6.5–8.1)

## 2022-06-27 LAB — PROTIME-INR
INR: 1 (ref 0.8–1.2)
Prothrombin Time: 13.7 seconds (ref 11.4–15.2)

## 2022-06-27 MED ORDER — ONDANSETRON 4 MG PO TBDP
8.0000 mg | ORAL_TABLET | Freq: Once | ORAL | Status: AC
  Administered 2022-06-27: 8 mg via ORAL
  Filled 2022-06-27: qty 2

## 2022-06-27 MED ORDER — MECLIZINE HCL 25 MG PO TABS: 25.0000 mg | ORAL_TABLET | Freq: Three times a day (TID) | ORAL | 0 refills | Status: AC | PRN

## 2022-06-27 MED ORDER — MECLIZINE HCL 25 MG PO TABS
25.0000 mg | ORAL_TABLET | Freq: Once | ORAL | Status: AC
  Administered 2022-06-27: 25 mg via ORAL
  Filled 2022-06-27: qty 1

## 2022-06-27 MED ORDER — ONDANSETRON HCL 4 MG PO TABS: 4.0000 mg | ORAL_TABLET | Freq: Four times a day (QID) | ORAL | 0 refills | Status: AC

## 2022-06-27 NOTE — ED Notes (Signed)
ED Provider at bedside. 

## 2022-06-27 NOTE — ED Provider Notes (Signed)
[facility] [place] [facility] AT [facility] Provider Note  CSN: [id number] Arrival date & time: 06/27/22 0844  Chief Complaint(s) Dizziness  HPI Melanie Humphrey is a 32 y.o. female with history of lupus presenting to the emergency department with dizziness.  Patient reports dizziness for around 4 days.  Reports associated body aches, nausea vomiting.  Describes it as a spinning sensation.  Worse with head movement or standing.  Improves with rest and closing eyes.  No fevers, no headaches, no chest pain or shortness of breath, no abdominal pain, no neck pain, no vision changes or hearing changes.  No head trauma.   Past Medical History Past Medical History:  Diagnosis Date   Lupus (HCC)    Migraine    PCOS (polycystic ovarian syndrome)    There are no problems to display for this patient.  Home Medication(s) Prior to Admission medications   Medication Sig Start Date End Date Taking? Authorizing Provider  amphetamine-dextroamphetamine (ADDERALL) 20 MG tablet Take 25 mg by mouth daily.   Yes [provider]  aspirin EC 81 MG tablet Take 81 mg by mouth daily. Swallow whole.   Yes [provider]  cholecalciferol (VITAMIN D3) 25 MCG (1000 UNIT) tablet Take 1,000 Units by mouth daily.   Yes [provider]  meclizine (ANTIVERT) 25 MG tablet Take 1 tablet (25 mg total) by mouth 3 (three) times daily as needed for dizziness. 06/27/22  Yes Lonell Grandchild, MD  ondansetron (ZOFRAN) 4 MG tablet Take 1 tablet (4 mg total) by mouth every 6 (six) hours. 06/27/22  Yes Lonell Grandchild, MD                                                                                                                                    Past Surgical History History reviewed. No pertinent surgical history. Family History History reviewed. No pertinent family history.  Social History Social History   Tobacco Use   Smoking status: Never   Smokeless tobacco: Never   Vaping Use   Vaping Use: Never used  Substance Use Topics   Alcohol use: Not Currently   Drug use: Never   Allergies Ciprofloxacin and Clindamycin/lincomycin  Review of Systems Review of Systems  All other systems reviewed and are negative.   Physical Exam Vital Signs  I have reviewed the triage vital signs BP 92/67   Pulse (!) 59   Temp 98.4 F (36.9 C)   Resp 14   Ht 4\' 11"  (1.499 m)   Wt 77.6 kg   SpO2 98%   BMI 34.54 kg/m  Physical Exam Vitals and nursing note reviewed.  Constitutional:      General: She is not in acute distress.    Appearance: She is well-developed.  HENT:     Head: Normocephalic and atraumatic.     Left Ear: Tympanic membrane normal.     Ears:     Comments:  Scattered bubbles behind right tm without opacification or bulging     Mouth/Throat:     Mouth: Mucous membranes are moist.  Eyes:     Extraocular Movements: Extraocular movements intact.     Conjunctiva/sclera: Conjunctivae normal.     Pupils: Pupils are equal, round, and reactive to light.     Comments: Rightward beating nystagmus.  No vertical or rotary nystagmus  Cardiovascular:     Rate and Rhythm: Normal rate and regular rhythm.     Heart sounds: No murmur heard. Pulmonary:     Effort: Pulmonary effort is normal. No respiratory distress.     Breath sounds: Normal breath sounds.  Abdominal:     General: Abdomen is flat.     Palpations: Abdomen is soft.     Tenderness: There is no abdominal tenderness.  Musculoskeletal:        General: No tenderness.     Right lower leg: No edema.     Left lower leg: No edema.  Skin:    General: Skin is warm and dry.  Neurological:     General: No focal deficit present.     Mental Status: She is alert. Mental status is at baseline.     Comments: Cranial nerves II through XII intact, strength 5 out of 5 in the bilateral upper and lower extremities, no sensory deficit to light touch, no dysmetria on finger-nose-finger testing, normal heel  shin testing  Psychiatric:        Mood and Affect: Mood normal.        Behavior: Behavior normal.     ED Results and Treatments Labs (all labs ordered are listed, but only abnormal results are displayed) Labs Reviewed  COMPREHENSIVE METABOLIC PANEL - Abnormal; Notable for the following components:      Result Value   Glucose, Bld 100 (*)    All other components within normal limits  CBC WITH DIFFERENTIAL/PLATELET  PROTIME-INR                                                                                                                          Radiology No results found.  Pertinent labs & imaging results that were available during my care of the patient were reviewed by me and considered in my medical decision making (see MDM for details).  Medications Ordered in ED Medications  meclizine (ANTIVERT) tablet 25 mg (25 mg Oral Given 06/27/22 0934)  ondansetron (ZOFRAN-ODT) disintegrating tablet 8 mg (8 mg Oral Given 06/27/22 0934)  Procedures Procedures  (including critical care time)  Medical Decision Making / ED Course   MDM:  32 year old female presenting with dizziness.  Patient well-appearing, neurologic exam reassuring without focal deficit, does have some rightward beating nystagmus and TM exam with some bubbles behind right TM  Suspect likely inner ear cause of patient's vertigo, possible labyrinthitis.  Will trial meclizine and Zofran.  Very low concern for central cause of vertigo such as stroke, brain tumor, intracranial infectious process, bleed.  Neurologic exam reassuring.  Reviewed outside records, patient tested negative for lupus anticoagulant.  Will check basic labs given history of lupus.  Will reassess.  Clinical Course as of 06/27/22 1112  Wed Jun 27, 2022  1111 Patient reports her symptoms are much improved after meclizine  and Zofran.  Laboratory tests are overall unremarkable. Will discharge patient to home. All questions answered. Patient comfortable with plan of discharge. Return precautions discussed with patient and specified on the after visit summary.  [WS]    Clinical Course User Index [WS] Lonell Grandchild, MD     Additional history obtained: -Additional history obtained from spouse -External records from outside source obtained and reviewed including: Chart review including previous notes, labs, imaging, consultation notes including outside rheumatology records   Lab Tests: -I ordered, reviewed, and interpreted labs.   The pertinent results include:   Labs Reviewed  COMPREHENSIVE METABOLIC PANEL - Abnormal; Notable for the following components:      Result Value   Glucose, Bld 100 (*)    All other components within normal limits  CBC WITH DIFFERENTIAL/PLATELET  PROTIME-INR    Notable for essentially normal labs  EKG   EKG Interpretation  Date/Time:  Wednesday Jun 27 2022 08:55:37 EDT Ventricular Rate:  74 PR Interval:  160 QRS Duration: 99 QT Interval:  382 QTC Calculation: 424 R Axis:   84 Text Interpretation: Sinus rhythm Confirmed by Alvino Blood ([id number]) on 06/27/2022 9:00:22 AM          Medicines ordered and prescription drug management: Meds ordered this encounter  Medications   meclizine (ANTIVERT) tablet 25 mg   ondansetron (ZOFRAN-ODT) disintegrating tablet 8 mg   meclizine (ANTIVERT) 25 MG tablet    Sig: Take 1 tablet (25 mg total) by mouth 3 (three) times daily as needed for dizziness.    Dispense:  30 tablet    Refill:  0   ondansetron (ZOFRAN) 4 MG tablet    Sig: Take 1 tablet (4 mg total) by mouth every 6 (six) hours.    Dispense:  16 tablet    Refill:  0    -I have reviewed the patients home medicines and have made adjustments as needed   Social Determinants of Health:  Diagnosis or treatment significantly limited by social determinants of  health: obesity   Reevaluation: After the interventions noted above, I reevaluated the patient and found that their symptoms have improved  Co morbidities that complicate the patient evaluation  Past Medical History:  Diagnosis Date   Lupus (HCC)    Migraine    PCOS (polycystic ovarian syndrome)       Dispostion: Disposition decision including need for hospitalization was considered, and patient discharged from emergency department.    Final Clinical Impression(s) / ED Diagnoses Final diagnoses:  Peripheral vertigo involving right ear     This chart was dictated using voice recognition software.  Despite best efforts to proofread,  errors can occur which can change the documentation meaning.    Lonell Grandchild,  MD 06/27/22 1112

## 2022-06-27 NOTE — Discharge Instructions (Addendum)
We evaluated you for your dizziness (vertigo).  Based on your physical examination, I think your symptoms are arising from an abnormality in your inner ear.  You did have some fluid behind your right eardrum which can be associated with a viral infection that can cause these symptoms.  We did not see any sign that your symptoms were due to a stroke or a intracranial process such as a tumor.  We have prescribed you meclizine for dizziness which you can take 3 times a day as needed, and Zofran for nausea which you can take every 6 hours as needed.  Please schedule an appointment with your primary doctor in 1 week for follow-up.  If you notice any new symptoms such as numbness or tingling on one side of your body, visual changes, clumsiness, trouble speaking or trouble swallowing, trouble walking, please return to the emergency department as these could be signs of a stroke or intracranial cause of dizziness.

## 2022-06-27 NOTE — ED Triage Notes (Signed)
Patient arrives ambulatory by POV with significant other c/o dizziness, body aches and nausea onset of Monday. Patient concerned for lupus flare up. Describes it as the room is spinning.
